# Patient Record
Sex: Female | Born: 1965
Health system: Southern US, Community
[De-identification: ages and names within clinical notes are randomized; demographics above are authoritative.]

## PROBLEM LIST (undated history)

## (undated) DIAGNOSIS — K5792 Diverticulitis of intestine, part unspecified, without perforation or abscess without bleeding: Secondary | ICD-10-CM

---

## 2011-07-28 ENCOUNTER — Other Ambulatory Visit: Payer: Self-pay | Admitting: Oncology

## 2015-09-06 DIAGNOSIS — Z111 Encounter for screening for respiratory tuberculosis: Secondary | ICD-10-CM | POA: Diagnosis not present

## 2015-10-20 DIAGNOSIS — R112 Nausea with vomiting, unspecified: Secondary | ICD-10-CM | POA: Diagnosis not present

## 2015-10-20 DIAGNOSIS — H8113 Benign paroxysmal vertigo, bilateral: Secondary | ICD-10-CM | POA: Diagnosis not present

## 2015-10-20 DIAGNOSIS — R42 Dizziness and giddiness: Secondary | ICD-10-CM | POA: Diagnosis not present

## 2015-10-20 DIAGNOSIS — H66002 Acute suppurative otitis media without spontaneous rupture of ear drum, left ear: Secondary | ICD-10-CM | POA: Diagnosis not present

## 2015-10-25 DIAGNOSIS — K5732 Diverticulitis of large intestine without perforation or abscess without bleeding: Secondary | ICD-10-CM | POA: Diagnosis not present

## 2015-10-25 DIAGNOSIS — H6692 Otitis media, unspecified, left ear: Secondary | ICD-10-CM | POA: Diagnosis not present

## 2016-01-25 DIAGNOSIS — K5732 Diverticulitis of large intestine without perforation or abscess without bleeding: Secondary | ICD-10-CM | POA: Diagnosis not present

## 2016-02-14 DIAGNOSIS — Z23 Encounter for immunization: Secondary | ICD-10-CM | POA: Diagnosis not present

## 2016-05-02 DIAGNOSIS — Z1231 Encounter for screening mammogram for malignant neoplasm of breast: Secondary | ICD-10-CM | POA: Diagnosis not present

## 2016-07-11 DIAGNOSIS — Z1151 Encounter for screening for human papillomavirus (HPV): Secondary | ICD-10-CM | POA: Diagnosis not present

## 2016-07-11 DIAGNOSIS — Z01419 Encounter for gynecological examination (general) (routine) without abnormal findings: Secondary | ICD-10-CM | POA: Diagnosis not present

## 2016-07-11 DIAGNOSIS — G479 Sleep disorder, unspecified: Secondary | ICD-10-CM | POA: Diagnosis not present

## 2016-07-11 DIAGNOSIS — L659 Nonscarring hair loss, unspecified: Secondary | ICD-10-CM | POA: Diagnosis not present

## 2016-07-11 DIAGNOSIS — Z6824 Body mass index (BMI) 24.0-24.9, adult: Secondary | ICD-10-CM | POA: Diagnosis not present

## 2016-07-11 DIAGNOSIS — R5383 Other fatigue: Secondary | ICD-10-CM | POA: Diagnosis not present

## 2016-10-02 DIAGNOSIS — H1030 Unspecified acute conjunctivitis, unspecified eye: Secondary | ICD-10-CM | POA: Diagnosis not present

## 2016-10-02 DIAGNOSIS — R030 Elevated blood-pressure reading, without diagnosis of hypertension: Secondary | ICD-10-CM | POA: Diagnosis not present

## 2016-10-02 DIAGNOSIS — Z6824 Body mass index (BMI) 24.0-24.9, adult: Secondary | ICD-10-CM | POA: Diagnosis not present

## 2016-10-09 DIAGNOSIS — K080 Exfoliation of teeth due to systemic causes: Secondary | ICD-10-CM | POA: Diagnosis not present

## 2016-11-14 DIAGNOSIS — N63 Unspecified lump in unspecified breast: Secondary | ICD-10-CM | POA: Diagnosis not present

## 2016-11-14 DIAGNOSIS — Z6824 Body mass index (BMI) 24.0-24.9, adult: Secondary | ICD-10-CM | POA: Diagnosis not present

## 2016-11-16 DIAGNOSIS — N6320 Unspecified lump in the left breast, unspecified quadrant: Secondary | ICD-10-CM | POA: Diagnosis not present

## 2016-11-16 DIAGNOSIS — N6002 Solitary cyst of left breast: Secondary | ICD-10-CM | POA: Diagnosis not present

## 2016-11-16 DIAGNOSIS — R922 Inconclusive mammogram: Secondary | ICD-10-CM | POA: Diagnosis not present

## 2017-02-16 DIAGNOSIS — Z23 Encounter for immunization: Secondary | ICD-10-CM | POA: Diagnosis not present

## 2017-02-16 DIAGNOSIS — Z803 Family history of malignant neoplasm of breast: Secondary | ICD-10-CM | POA: Diagnosis not present

## 2017-02-16 DIAGNOSIS — L659 Nonscarring hair loss, unspecified: Secondary | ICD-10-CM | POA: Diagnosis not present

## 2017-02-16 DIAGNOSIS — R5383 Other fatigue: Secondary | ICD-10-CM | POA: Diagnosis not present

## 2017-02-16 DIAGNOSIS — Z3009 Encounter for other general counseling and advice on contraception: Secondary | ICD-10-CM | POA: Diagnosis not present

## 2017-04-20 DIAGNOSIS — Z6824 Body mass index (BMI) 24.0-24.9, adult: Secondary | ICD-10-CM | POA: Diagnosis not present

## 2017-04-20 DIAGNOSIS — N951 Menopausal and female climacteric states: Secondary | ICD-10-CM | POA: Diagnosis not present

## 2017-04-20 DIAGNOSIS — F430 Acute stress reaction: Secondary | ICD-10-CM | POA: Diagnosis not present

## 2017-04-20 DIAGNOSIS — M545 Low back pain: Secondary | ICD-10-CM | POA: Diagnosis not present

## 2017-06-06 DIAGNOSIS — Z3202 Encounter for pregnancy test, result negative: Secondary | ICD-10-CM | POA: Diagnosis not present

## 2017-07-20 DIAGNOSIS — Z01419 Encounter for gynecological examination (general) (routine) without abnormal findings: Secondary | ICD-10-CM | POA: Diagnosis not present

## 2017-07-20 DIAGNOSIS — N951 Menopausal and female climacteric states: Secondary | ICD-10-CM | POA: Diagnosis not present

## 2017-07-20 DIAGNOSIS — R591 Generalized enlarged lymph nodes: Secondary | ICD-10-CM | POA: Diagnosis not present

## 2017-07-20 DIAGNOSIS — R590 Localized enlarged lymph nodes: Secondary | ICD-10-CM | POA: Diagnosis not present

## 2017-07-27 DIAGNOSIS — Z1231 Encounter for screening mammogram for malignant neoplasm of breast: Secondary | ICD-10-CM | POA: Diagnosis not present

## 2017-08-07 DIAGNOSIS — Z111 Encounter for screening for respiratory tuberculosis: Secondary | ICD-10-CM | POA: Diagnosis not present

## 2017-08-08 DIAGNOSIS — K080 Exfoliation of teeth due to systemic causes: Secondary | ICD-10-CM | POA: Diagnosis not present

## 2017-09-21 DIAGNOSIS — K5732 Diverticulitis of large intestine without perforation or abscess without bleeding: Secondary | ICD-10-CM | POA: Diagnosis not present

## 2017-12-27 ENCOUNTER — Emergency Department (HOSPITAL_BASED_OUTPATIENT_CLINIC_OR_DEPARTMENT_OTHER)
Admission: EM | Admit: 2017-12-27 | Discharge: 2017-12-27 | Disposition: A | Discharge status: Home / Self Care | Payer: Federal, State, Local not specified - PPO | Attending: Emergency Medicine | Admitting: Emergency Medicine

## 2017-12-27 ENCOUNTER — Encounter (HOSPITAL_BASED_OUTPATIENT_CLINIC_OR_DEPARTMENT_OTHER): Payer: Self-pay | Admitting: Emergency Medicine

## 2017-12-27 ENCOUNTER — Emergency Department (HOSPITAL_BASED_OUTPATIENT_CLINIC_OR_DEPARTMENT_OTHER): Payer: Federal, State, Local not specified - PPO

## 2017-12-27 ENCOUNTER — Other Ambulatory Visit: Payer: Self-pay

## 2017-12-27 DIAGNOSIS — Z79899 Other long term (current) drug therapy: Secondary | ICD-10-CM | POA: Diagnosis not present

## 2017-12-27 DIAGNOSIS — R110 Nausea: Secondary | ICD-10-CM | POA: Diagnosis not present

## 2017-12-27 DIAGNOSIS — R1030 Lower abdominal pain, unspecified: Secondary | ICD-10-CM | POA: Insufficient documentation

## 2017-12-27 DIAGNOSIS — K5730 Diverticulosis of large intestine without perforation or abscess without bleeding: Secondary | ICD-10-CM | POA: Diagnosis not present

## 2017-12-27 HISTORY — DX: Diverticulitis of intestine, part unspecified, without perforation or abscess without bleeding: K57.92

## 2017-12-27 LAB — BASIC METABOLIC PANEL WITH GFR
Anion gap: 11 (ref 5–15)
BUN: 11 mg/dL (ref 6–20)
CO2: 23 mmol/L (ref 22–32)
Calcium: 8.7 mg/dL — ABNORMAL LOW (ref 8.9–10.3)
Chloride: 105 mmol/L (ref 98–111)
Creatinine, Ser: 0.7 mg/dL (ref 0.44–1.00)
GFR calc Af Amer: >60 mL/min (ref >60)
GFR calc non Af Amer: >60 mL/min (ref >60)
Glucose, Bld: 127 mg/dL — ABNORMAL HIGH (ref 70–99)
Potassium: 3.8 mmol/L (ref 3.5–5.1)
Sodium: 139 mmol/L (ref 135–145)

## 2017-12-27 LAB — URINALYSIS, ROUTINE W REFLEX MICROSCOPIC
Bilirubin Urine: NEGATIVE
Glucose, UA: NEGATIVE mg/dL
Ketones, ur: 40 mg/dL — AB
Leukocytes, UA: NEGATIVE
Nitrite: NEGATIVE
Protein, ur: NEGATIVE mg/dL
Specific Gravity, Urine: 1.02 (ref 1.005–1.030)
pH: 5.5 (ref 5.0–8.0)

## 2017-12-27 LAB — URINALYSIS, MICROSCOPIC (REFLEX)

## 2017-12-27 LAB — CBC WITH DIFFERENTIAL/PLATELET
Basophils Absolute: 0 K/uL (ref 0.0–0.1)
Basophils Relative: 0 %
Eosinophils Absolute: 0 K/uL (ref 0.0–0.7)
Eosinophils Relative: 0 %
HCT: 42.2 % (ref 36.0–46.0)
Hemoglobin: 14.4 g/dL (ref 12.0–15.0)
Lymphocytes Relative: 10 %
Lymphs Abs: 1 K/uL (ref 0.7–4.0)
MCH: 30.4 pg (ref 26.0–34.0)
MCHC: 34.1 g/dL (ref 30.0–36.0)
MCV: 89 fL (ref 78.0–100.0)
Monocytes Absolute: 0.5 K/uL (ref 0.1–1.0)
Monocytes Relative: 5 %
Neutro Abs: 8.3 K/uL — ABNORMAL HIGH (ref 1.7–7.7)
Neutrophils Relative %: 85 %
Platelets: 238 K/uL (ref 150–400)
RBC: 4.74 MIL/uL (ref 3.87–5.11)
RDW: 13.6 % (ref 11.5–15.5)
WBC: 9.9 K/uL (ref 4.0–10.5)

## 2017-12-27 MED ORDER — FENTANYL CITRATE (PF) 100 MCG/2ML IJ SOLN
50.0000 ug | Freq: Once | INTRAMUSCULAR | Status: AC
  Administered 2017-12-27: 50 ug via INTRAVENOUS
  Filled 2017-12-27: qty 2

## 2017-12-27 MED ORDER — IOPAMIDOL (ISOVUE-300) INJECTION 61%
100.0000 mL | Freq: Once | INTRAVENOUS | Status: AC | PRN
  Administered 2017-12-27: 100 mL via INTRAVENOUS

## 2017-12-27 MED ORDER — ONDANSETRON HCL 4 MG/2ML IJ SOLN
4.0000 mg | Freq: Once | INTRAMUSCULAR | Status: AC
  Administered 2017-12-27: 4 mg via INTRAVENOUS
  Filled 2017-12-27: qty 2

## 2017-12-27 MED ORDER — TRAMADOL HCL 50 MG PO TABS: 50.0000 mg | ORAL_TABLET | Freq: Two times a day (BID) | ORAL | 0 refills | Status: AC | PRN

## 2017-12-27 MED ORDER — DICYCLOMINE HCL 20 MG PO TABS: 20.0000 mg | ORAL_TABLET | Freq: Three times a day (TID) | ORAL | 0 refills | Status: AC | PRN

## 2017-12-27 MED ORDER — IOPAMIDOL (ISOVUE-300) INJECTION 61%
30.0000 mL | Freq: Once | INTRAVENOUS | Status: AC | PRN
  Administered 2017-12-27: 30 mL via ORAL

## 2017-12-27 MED ORDER — SODIUM CHLORIDE 0.9 % IV BOLUS
500.0000 mL | Freq: Once | INTRAVENOUS | Status: AC
  Administered 2017-12-27: 500 mL via INTRAVENOUS

## 2017-12-27 MED FILL — DICYCLOMINE 20 MG TABLET: 20 | 3 days supply | Qty: 10 | Fill #0

## 2017-12-27 MED FILL — traMADol HCL 50 MG TABS: 50 | 4 days supply | Qty: 8 | Fill #0

## 2017-12-27 NOTE — ED Notes (Signed)
ED Provider at bedside. 

## 2017-12-27 NOTE — ED Triage Notes (Signed)
Lower abd pain for several days with nausea. States she just finished abx for diverticulitis with no relief.

## 2017-12-27 NOTE — ED Provider Notes (Signed)
[facility] EMERGENCY DEPARTMENT Provider Note   CSN: [id number] Arrival date & time: 12/27/17  0705     History   Chief Complaint Chief Complaint  Patient presents with  . Abdominal Pain    HPI Jamie Patton is a 52 y.o. female.  HPI Patient presents with abdominal pain.  Began around a week and a half ago.  Has been on Avelox for diverticulitis.  Has had diverticulitis several times in the past.  Pain had improved some but today became much more severe.  Has had some nausea and vomiting.  No diarrhea.  No fevers but had had chills previously.  States the pain was much more severe today.  Almost felt as if the cramping in her lower abdomen.  States the tenderness is improved. Past Medical History:  Diagnosis Date  . Diverticulitis     There are no active problems to display for this patient.   Past Surgical History:  Procedure Laterality Date  . CESAREAN SECTION       OB History   None      Home Medications    Prior to Admission medications   Medication Sig Start Date End Date Taking? Authorizing Provider  norethindrone-ethinyl estradiol-iron (ESTROSTEP FE,TILIA FE,TRI-LEGEST FE) 1-20/1-30/1-35 MG-MCG tablet Take 1 tablet by mouth daily.   Yes [provider]  dicyclomine (BENTYL) 20 MG tablet Take 1 tablet (20 mg total) by mouth 3 (three) times daily as needed for spasms. 12/27/17   Benjiman Core, MD  traMADol (ULTRAM) 50 MG tablet Take 1 tablet (50 mg total) by mouth every 12 (twelve) hours as needed. 12/27/17   Benjiman Core, MD    Family History No family history on file.  Social History Social History   Tobacco Use  . Smoking status: Never Smoker  . Smokeless tobacco: Never Used  Substance Use Topics  . Alcohol use: Never    Frequency: Never  . Drug use: Not on file     Allergies   Penicillins   Review of Systems Review of Systems  Constitutional: Positive for appetite change. Negative for fever.  HENT: Negative  for congestion.   Respiratory: Negative for shortness of breath.   Cardiovascular: Negative for chest pain.  Gastrointestinal: Positive for abdominal pain and nausea.  Genitourinary: Negative for dysuria, vaginal bleeding and vaginal discharge.  Musculoskeletal: Negative for back pain.  Skin: Negative for rash.  Neurological: Negative for weakness.  Psychiatric/Behavioral: Negative for confusion.     Physical Exam Updated Vital Signs BP 130/88 (BP Location: Right Arm)   Pulse 86   Temp 98.7 F (37.1 C) (Oral)   Resp 18   Ht 5' (1.524 m)   Wt 59 kg   SpO2 97%   BMI 25.39 kg/m   Physical Exam  Constitutional: She appears well-developed.  HENT:  Head: Normocephalic.  Eyes: Pupils are equal, round, and reactive to light.  Cardiovascular: Normal rate.  Pulmonary/Chest: Breath sounds normal.  Abdominal: Normal appearance.  No tenderness or mass.  Neurological: She is alert.  Skin: Skin is warm. Capillary refill takes less than 2 seconds.     ED Treatments / Results  Labs (all labs ordered are listed, but only abnormal results are displayed) Labs Reviewed  BASIC METABOLIC PANEL - Abnormal; Notable for the following components:      Result Value   Glucose, Bld 127 (*)    Calcium 8.7 (*)    All other components within normal limits  CBC WITH DIFFERENTIAL/PLATELET - Abnormal; Notable  for the following components:   Neutro Abs 8.3 (*)    All other components within normal limits  URINALYSIS, ROUTINE W REFLEX MICROSCOPIC - Abnormal; Notable for the following components:   Hgb urine dipstick TRACE (*)    Ketones, ur 40 (*)    All other components within normal limits  URINALYSIS, MICROSCOPIC (REFLEX) - Abnormal; Notable for the following components:   Bacteria, UA FEW (*)    All other components within normal limits    EKG None  Radiology Ct Abdomen Pelvis W Contrast  Result Date: 12/27/2017 CLINICAL DATA:  52 year old female with a history lower abdominal pain  for several days. EXAM: CT ABDOMEN AND PELVIS WITH CONTRAST TECHNIQUE: Multidetector CT imaging of the abdomen and pelvis was performed using the standard protocol following bolus administration of intravenous contrast. CONTRAST:  30mL ISOVUE-300 IOPAMIDOL (ISOVUE-300) INJECTION 61%, ISOVUE-300 IOPAMIDOL (ISOVUE-300) INJECTION 61% COMPARISON:  None. FINDINGS: Lower chest: No acute abnormality. Hepatobiliary: Unremarkable appearance of the liver. Unremarkable gallbladder Pancreas: Unremarkable pancreas Spleen: Unremarkable spleen Adrenals/Urinary Tract: Unremarkable appearance of the adrenal glands. No evidence of hydronephrosis of the right or left kidney. No nephrolithiasis. Unremarkable course of the bilateral ureters. Unremarkable appearance of the urinary bladder. Stomach/Bowel: Unremarkable appearance of stomach. Unremarkable small bowel. No abnormal distention or transition point. Normal appendix. Colonic diverticula are present. No significant inflammatory changes. No focal fluid. No evidence of extraluminal air. Vascular/Lymphatic: No significant atherosclerotic changes. Mesenteric arteries, renal arteries, bilateral iliac arteries and proximal femoral arteries patent. No evidence of lymphadenopathy. Reproductive: Physiologic changes of the uterus. Cystic structure associated with the right adnexa measures 2.5 cm. Other: Small fat containing umbilical hernia. Musculoskeletal: No acute displaced fracture. Degenerative changes of the spine. IMPRESSION: No acute CT finding. Colonic diverticular disease without evidence of acute diverticulitis on the current CT. Physiologic changes of the uterus and adnexa, including a right-sided ovarian cyst/follicle. Electronically Signed   By: Gilmer Mor D.O.   On: 12/27/2017 10:22    Procedures Procedures (including critical care time)  Medications Ordered in ED Medications  sodium chloride 0.9 % bolus 500 mL (0 mLs Intravenous Stopped 12/27/17 0907)    fentaNYL (SUBLIMAZE) injection 50 mcg (50 mcg Intravenous Given 12/27/17 0751)  ondansetron (ZOFRAN) injection 4 mg (4 mg Intravenous Given 12/27/17 0751)  iopamidol (ISOVUE-300) 61 % injection 100 mL (100 mLs Intravenous Contrast Given 12/27/17 0939)  iopamidol (ISOVUE-300) 61 % injection 30 mL (30 mLs Oral Contrast Given 12/27/17 0939)     Initial Impression / Assessment and Plan / ED Course  I have reviewed the triage vital signs and the nursing notes.  Pertinent labs & imaging results that were available during my care of the patient were reviewed by me and considered in my medical decision making (see chart for details).     Patient with abdominal pain.  Recently presumed diverticulitis treated with antibiotics.  Pain increased today.  However tenderness is improved.  Lab work reassuring and CT scan reassuring except for a ovarian cyst.  Will treat with antispasmodics and small dose pain medicine.  Follow-up with PCP as needed.  Final Clinical Impressions(s) / ED Diagnoses   Final diagnoses:  Lower abdominal pain    ED Discharge Orders         Ordered    dicyclomine (BENTYL) 20 MG tablet  3 times daily PRN     12/27/17 1129    traMADol (ULTRAM) 50 MG tablet  Every 12 hours PRN     12/27/17 1129  Benjiman CorePickering, Mykel Mohl, MD 12/27/17 1131

## 2017-12-27 NOTE — Discharge Instructions (Addendum)
Follow-up with your doctor if your abdominal pain continues.  You also been ovarian cyst may need to be followed since you are postmenopausal.

## 2017-12-27 NOTE — ED Notes (Signed)
Patient transported to CT 

## 2018-01-04 DIAGNOSIS — N939 Abnormal uterine and vaginal bleeding, unspecified: Secondary | ICD-10-CM | POA: Diagnosis not present

## 2018-01-04 DIAGNOSIS — R102 Pelvic and perineal pain: Secondary | ICD-10-CM | POA: Diagnosis not present

## 2018-01-04 DIAGNOSIS — Z3202 Encounter for pregnancy test, result negative: Secondary | ICD-10-CM | POA: Diagnosis not present

## 2018-01-04 DIAGNOSIS — N83201 Unspecified ovarian cyst, right side: Secondary | ICD-10-CM | POA: Diagnosis not present

## 2018-01-17 DIAGNOSIS — Z23 Encounter for immunization: Secondary | ICD-10-CM | POA: Diagnosis not present

## 2018-02-14 DIAGNOSIS — N83201 Unspecified ovarian cyst, right side: Secondary | ICD-10-CM | POA: Diagnosis not present

## 2018-05-10 DIAGNOSIS — R102 Pelvic and perineal pain: Secondary | ICD-10-CM | POA: Diagnosis not present

## 2018-05-10 DIAGNOSIS — N83201 Unspecified ovarian cyst, right side: Secondary | ICD-10-CM | POA: Diagnosis not present

## 2018-06-06 DIAGNOSIS — R102 Pelvic and perineal pain: Secondary | ICD-10-CM | POA: Diagnosis not present

## 2018-06-06 DIAGNOSIS — Z8742 Personal history of other diseases of the female genital tract: Secondary | ICD-10-CM | POA: Diagnosis not present

## 2018-10-14 DIAGNOSIS — Z01419 Encounter for gynecological examination (general) (routine) without abnormal findings: Secondary | ICD-10-CM | POA: Diagnosis not present

## 2018-10-21 DIAGNOSIS — Z1231 Encounter for screening mammogram for malignant neoplasm of breast: Secondary | ICD-10-CM | POA: Diagnosis not present

## 2019-01-18 DIAGNOSIS — K5732 Diverticulitis of large intestine without perforation or abscess without bleeding: Secondary | ICD-10-CM | POA: Diagnosis not present

## 2019-01-18 DIAGNOSIS — N3001 Acute cystitis with hematuria: Secondary | ICD-10-CM | POA: Diagnosis not present

## 2019-01-18 DIAGNOSIS — R109 Unspecified abdominal pain: Secondary | ICD-10-CM | POA: Diagnosis not present

## 2019-02-11 DIAGNOSIS — K589 Irritable bowel syndrome without diarrhea: Secondary | ICD-10-CM | POA: Diagnosis not present

## 2019-02-11 DIAGNOSIS — Z6825 Body mass index (BMI) 25.0-25.9, adult: Secondary | ICD-10-CM | POA: Diagnosis not present

## 2019-02-11 DIAGNOSIS — Z23 Encounter for immunization: Secondary | ICD-10-CM | POA: Diagnosis not present

## 2019-02-11 DIAGNOSIS — K5792 Diverticulitis of intestine, part unspecified, without perforation or abscess without bleeding: Secondary | ICD-10-CM | POA: Diagnosis not present

## 2019-10-24 DIAGNOSIS — F419 Anxiety disorder, unspecified: Secondary | ICD-10-CM | POA: Diagnosis not present

## 2019-10-24 DIAGNOSIS — Z1151 Encounter for screening for human papillomavirus (HPV): Secondary | ICD-10-CM | POA: Diagnosis not present

## 2019-10-24 DIAGNOSIS — Z1231 Encounter for screening mammogram for malignant neoplasm of breast: Secondary | ICD-10-CM | POA: Diagnosis not present

## 2019-10-24 DIAGNOSIS — Z01411 Encounter for gynecological examination (general) (routine) with abnormal findings: Secondary | ICD-10-CM | POA: Diagnosis not present

## 2019-10-24 DIAGNOSIS — Z803 Family history of malignant neoplasm of breast: Secondary | ICD-10-CM | POA: Diagnosis not present

## 2019-10-24 DIAGNOSIS — Z01419 Encounter for gynecological examination (general) (routine) without abnormal findings: Secondary | ICD-10-CM | POA: Diagnosis not present

## 2019-10-24 DIAGNOSIS — G479 Sleep disorder, unspecified: Secondary | ICD-10-CM | POA: Diagnosis not present

## 2020-01-13 DIAGNOSIS — J22 Unspecified acute lower respiratory infection: Secondary | ICD-10-CM | POA: Diagnosis not present

## 2020-01-13 DIAGNOSIS — R197 Diarrhea, unspecified: Secondary | ICD-10-CM | POA: Diagnosis not present

## 2020-01-13 DIAGNOSIS — R42 Dizziness and giddiness: Secondary | ICD-10-CM | POA: Diagnosis not present

## 2020-01-13 DIAGNOSIS — Z20828 Contact with and (suspected) exposure to other viral communicable diseases: Secondary | ICD-10-CM | POA: Diagnosis not present

## 2020-02-13 DIAGNOSIS — Z6825 Body mass index (BMI) 25.0-25.9, adult: Secondary | ICD-10-CM | POA: Diagnosis not present

## 2020-02-13 DIAGNOSIS — K580 Irritable bowel syndrome with diarrhea: Secondary | ICD-10-CM | POA: Diagnosis not present

## 2020-02-13 DIAGNOSIS — K5792 Diverticulitis of intestine, part unspecified, without perforation or abscess without bleeding: Secondary | ICD-10-CM | POA: Diagnosis not present

## 2020-03-01 IMAGING — CT CT ABD-PELV W/ CM
2 of 5 series · 16 of 46 positions shown, 18 images · IV contrast (APPLIED)
Comparison: None.

CLINICAL DATA: 51-year-old female with a history lower abdominal
pain for several days.

EXAM:
CT ABDOMEN AND PELVIS WITH CONTRAST
TECHNIQUE: Multidetector CT imaging of the abdomen and pelvis was performed
using the standard protocol following bolus administration of
intravenous contrast.
CONTRAST:  30mL O08YXP-DLL IOPAMIDOL (O08YXP-DLL) INJECTION 61%,
100mL O08YXP-DLL IOPAMIDOL (O08YXP-DLL) INJECTION 61%

[Series 2: axial st · axial · 0.88mm/px · z∈[-449,-59]mm · 13 of 88 slices shown, 15 images]
[im 5/88  soft-tissue]
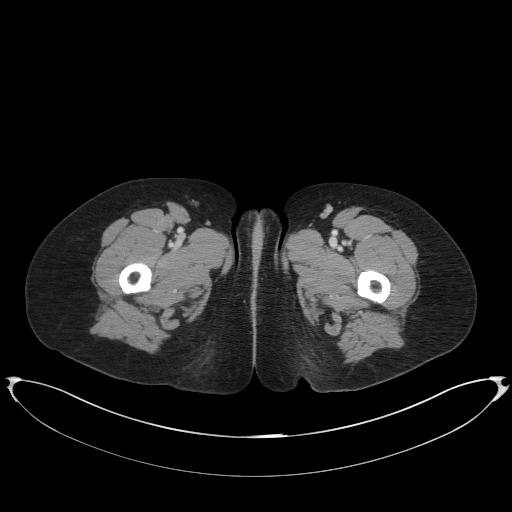
[im 5/88  bone]
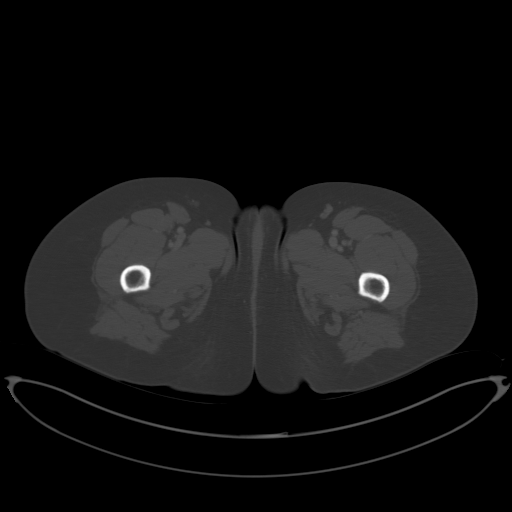
[im 10/88  soft-tissue]
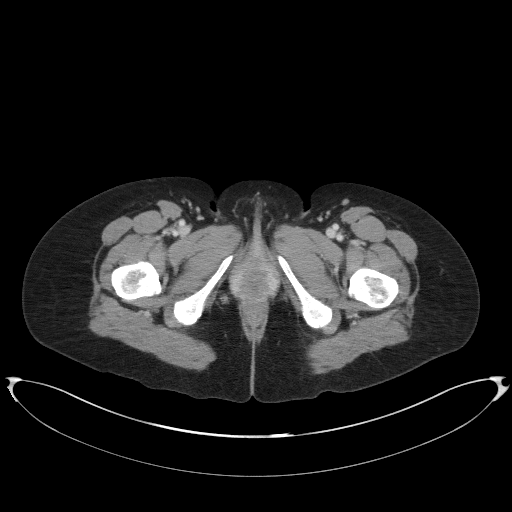
[im 20/88  soft-tissue]
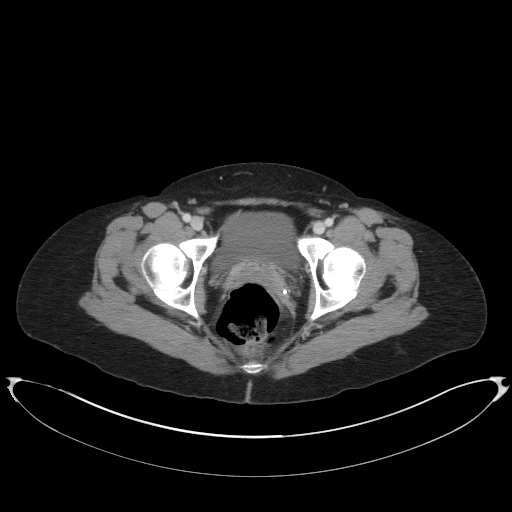
[im 25/88  soft-tissue]
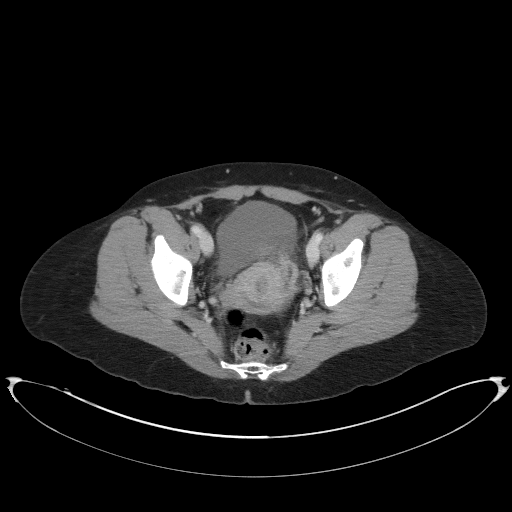
[im 30/88  soft-tissue]
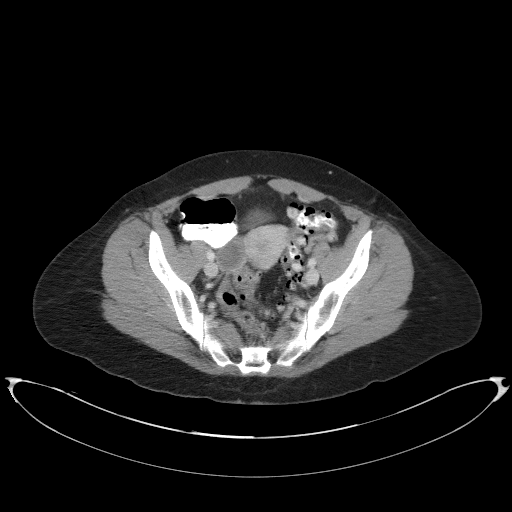
[im 39/88  soft-tissue]
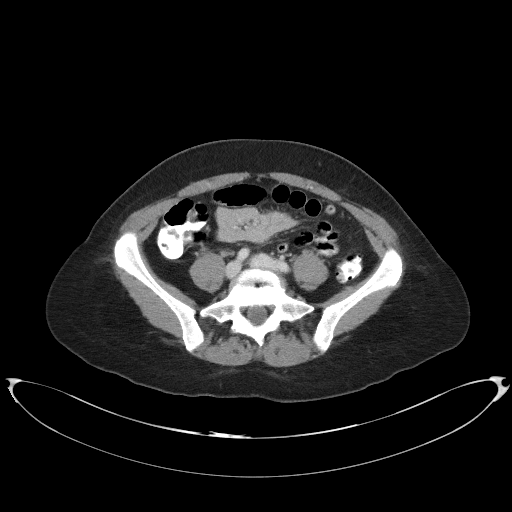
[im 44/88  soft-tissue]
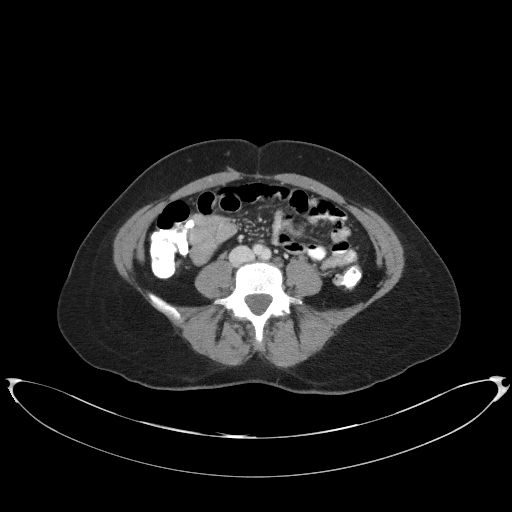
[im 49/88  soft-tissue]
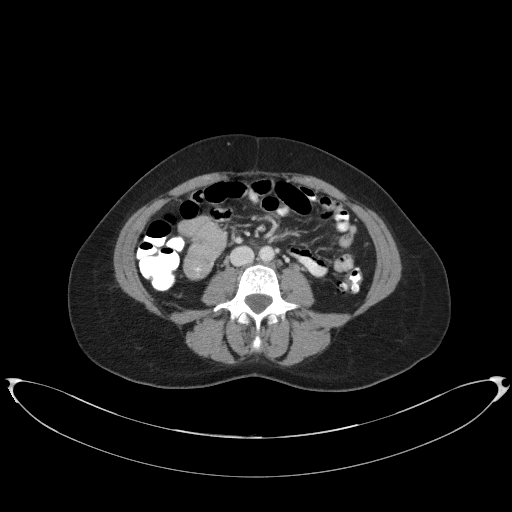
[im 59/88  soft-tissue]
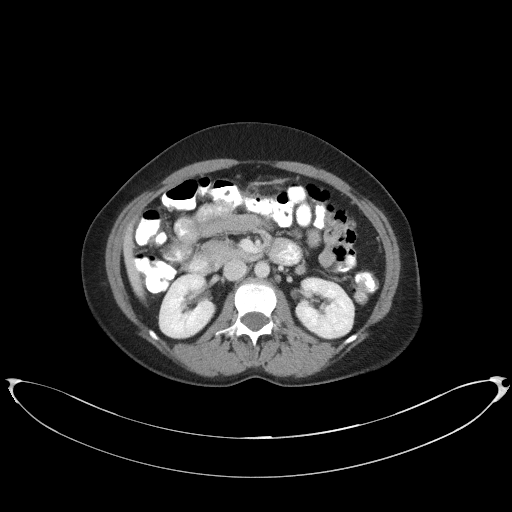
[im 59/88  bone]
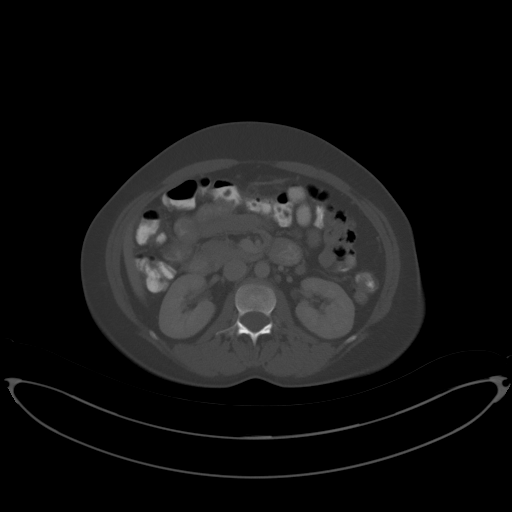
[im 63/88  soft-tissue]
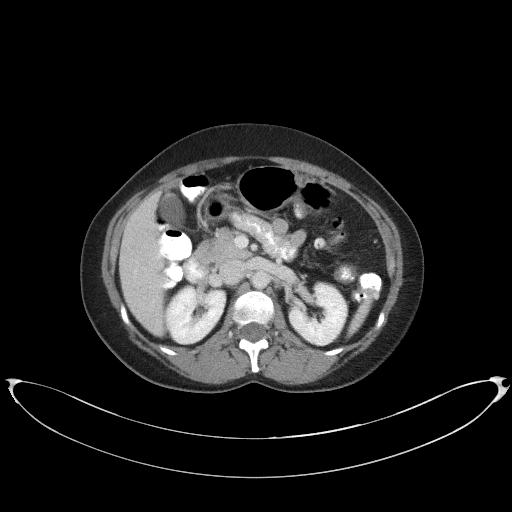
[im 68/88  soft-tissue]
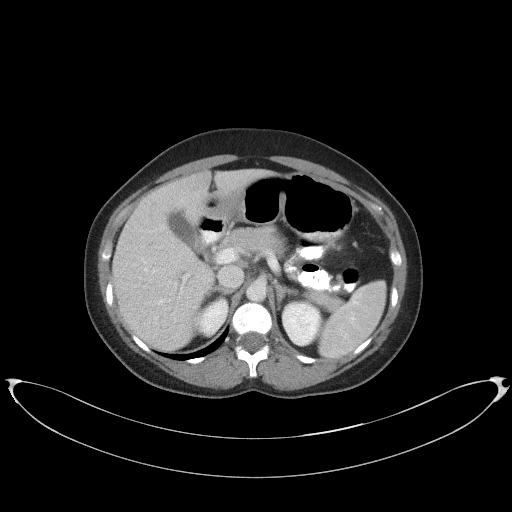
[im 78/88  soft-tissue]
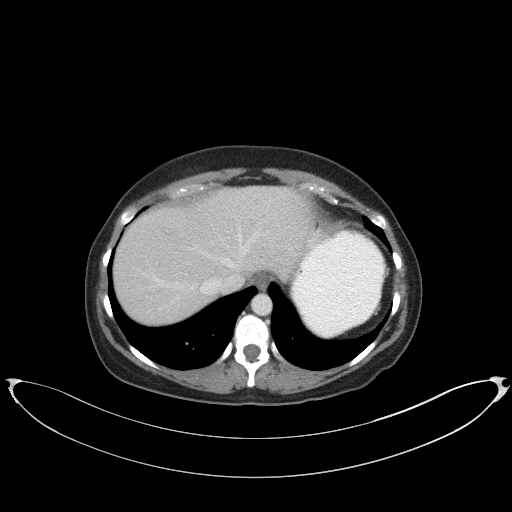
[im 83/88  soft-tissue]
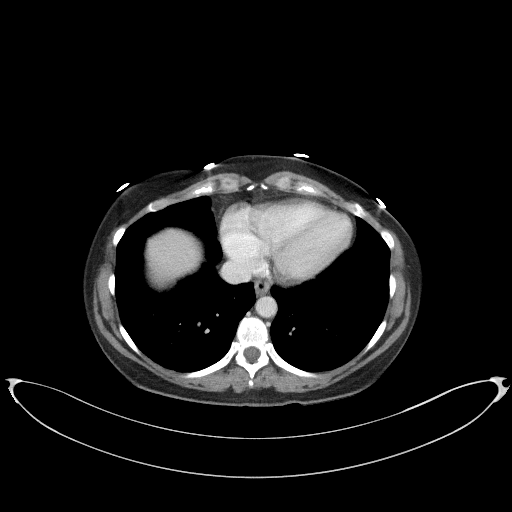

[Series 5: coronal st · coronal · 0.71mm/px · 3 of 83 slices shown]
[im 28/83  soft-tissue]
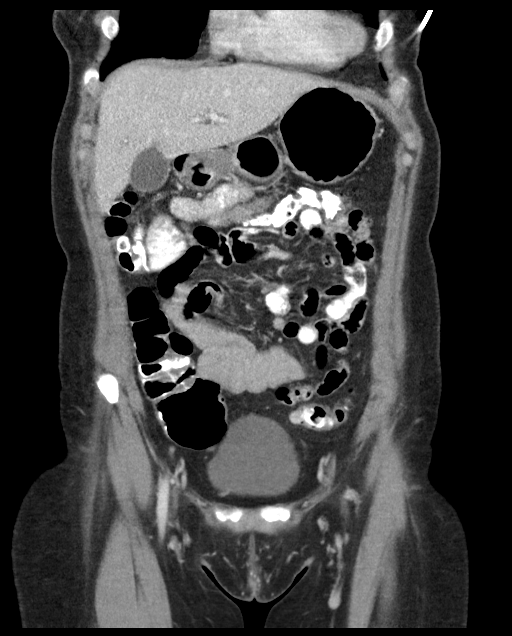
[im 37/83  soft-tissue]
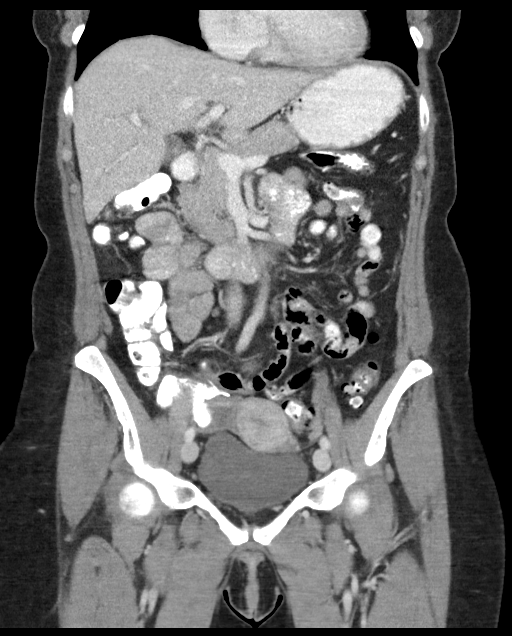
[im 46/83  soft-tissue]
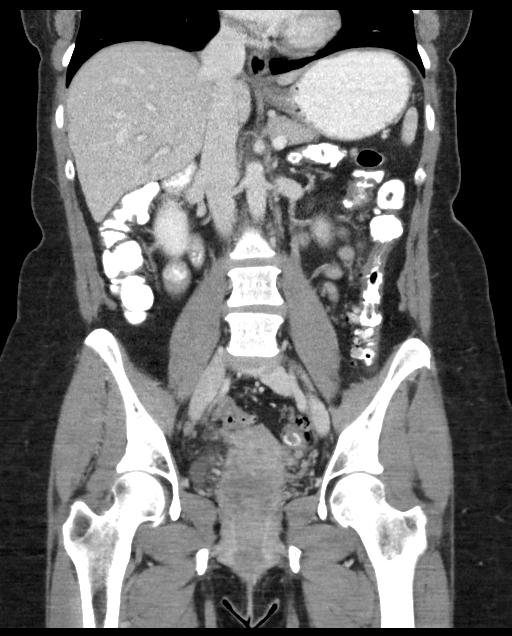

[16 of 46 positions shown; findings below may reference images not displayed]

FINDINGS: Lower chest: No acute abnormality.

Hepatobiliary: Unremarkable appearance of the liver. Unremarkable
gallbladder

Pancreas: Unremarkable pancreas

Spleen: Unremarkable spleen

Adrenals/Urinary Tract: Unremarkable appearance of the adrenal
glands. No evidence of hydronephrosis of the right or left kidney.
No nephrolithiasis. Unremarkable course of the bilateral ureters.
Unremarkable appearance of the urinary bladder.

Stomach/Bowel: Unremarkable appearance of stomach. Unremarkable
small bowel. No abnormal distention or transition point. Normal
appendix. Colonic diverticula are present. No significant
inflammatory changes. No focal fluid. No evidence of extraluminal
air.

Vascular/Lymphatic: No significant atherosclerotic changes.
Mesenteric arteries, renal arteries, bilateral iliac arteries and
proximal femoral arteries patent.

No evidence of lymphadenopathy.

Reproductive: Physiologic changes of the uterus. Cystic structure
associated with the right adnexa measures 2.5 cm.

Other: Small fat containing umbilical hernia.

Musculoskeletal: No acute displaced fracture. Degenerative changes
of the spine.
IMPRESSION: No acute CT finding.

Colonic diverticular disease without evidence of acute
diverticulitis on the current CT.

Physiologic changes of the uterus and adnexa, including a
right-sided ovarian cyst/follicle..

## 2020-11-05 DIAGNOSIS — Z1231 Encounter for screening mammogram for malignant neoplasm of breast: Secondary | ICD-10-CM | POA: Diagnosis not present

## 2021-06-28 DIAGNOSIS — Z20828 Contact with and (suspected) exposure to other viral communicable diseases: Secondary | ICD-10-CM | POA: Diagnosis not present

## 2021-06-28 DIAGNOSIS — Z6827 Body mass index (BMI) 27.0-27.9, adult: Secondary | ICD-10-CM | POA: Diagnosis not present

## 2021-06-28 DIAGNOSIS — J329 Chronic sinusitis, unspecified: Secondary | ICD-10-CM | POA: Diagnosis not present

## 2021-06-28 DIAGNOSIS — J40 Bronchitis, not specified as acute or chronic: Secondary | ICD-10-CM | POA: Diagnosis not present

## 2021-07-25 DIAGNOSIS — L821 Other seborrheic keratosis: Secondary | ICD-10-CM | POA: Diagnosis not present

## 2022-12-29 DIAGNOSIS — R1030 Lower abdominal pain, unspecified: Secondary | ICD-10-CM | POA: Diagnosis not present

## 2022-12-29 DIAGNOSIS — Z6823 Body mass index (BMI) 23.0-23.9, adult: Secondary | ICD-10-CM | POA: Diagnosis not present

## 2022-12-29 DIAGNOSIS — B3731 Acute candidiasis of vulva and vagina: Secondary | ICD-10-CM | POA: Diagnosis not present

## 2024-01-01 DIAGNOSIS — K5732 Diverticulitis of large intestine without perforation or abscess without bleeding: Secondary | ICD-10-CM | POA: Diagnosis not present

## 2024-01-01 DIAGNOSIS — R110 Nausea: Secondary | ICD-10-CM | POA: Diagnosis not present

## 2024-01-19 ENCOUNTER — Other Ambulatory Visit: Payer: Self-pay

## 2024-01-19 ENCOUNTER — Emergency Department (HOSPITAL_BASED_OUTPATIENT_CLINIC_OR_DEPARTMENT_OTHER)
Admission: EM | Admit: 2024-01-19 | Discharge: 2024-01-19 | Disposition: A | Discharge status: Home / Self Care | Attending: Emergency Medicine | Admitting: Emergency Medicine

## 2024-01-19 ENCOUNTER — Encounter (HOSPITAL_BASED_OUTPATIENT_CLINIC_OR_DEPARTMENT_OTHER): Payer: Self-pay | Admitting: Emergency Medicine

## 2024-01-19 DIAGNOSIS — N3090 Cystitis, unspecified without hematuria: Secondary | ICD-10-CM | POA: Diagnosis not present

## 2024-01-19 DIAGNOSIS — R300 Dysuria: Secondary | ICD-10-CM | POA: Diagnosis not present

## 2024-01-19 LAB — URINALYSIS, ROUTINE W REFLEX MICROSCOPIC
Bilirubin Urine: NEGATIVE
Glucose, UA: NEGATIVE mg/dL
Ketones, ur: NEGATIVE mg/dL
Nitrite: POSITIVE — AB
Protein, ur: 30 mg/dL — AB
Specific Gravity, Urine: >=1.03 (ref 1.005–1.030)
pH: 6 (ref 5.0–8.0)

## 2024-01-19 LAB — URINALYSIS, MICROSCOPIC (REFLEX)

## 2024-01-19 MED ORDER — CEFADROXIL 500 MG PO CAPS
500.0000 mg | ORAL_CAPSULE | Freq: Two times a day (BID) | ORAL | 0 refills | Status: DC
  Filled 2024-01-19: qty 12, 6d supply, fill #0

## 2024-01-19 MED ORDER — PHENAZOPYRIDINE HCL 200 MG PO TABS: 200.0000 mg | ORAL_TABLET | Freq: Three times a day (TID) | ORAL | 0 refills | Status: AC | PRN

## 2024-01-19 MED ORDER — CEPHALEXIN 250 MG PO CAPS
500.0000 mg | ORAL_CAPSULE | Freq: Once | ORAL | Status: AC
  Administered 2024-01-19: 500 mg via ORAL
  Filled 2024-01-19: qty 2

## 2024-01-19 MED ORDER — CEFADROXIL 500 MG PO CAPS: 500.0000 mg | ORAL_CAPSULE | Freq: Two times a day (BID) | ORAL | 0 refills | Status: AC

## 2024-01-19 NOTE — Discharge Instructions (Addendum)
 As discussed, you have a UTI.  Will place you on antibiotics for this.  We have sent off a urine culture and we will call you with antibiotic does not align with the bacteria that grows out on your urine culture.  Recommend follow-up with your primary care for reassessment.

## 2024-01-19 NOTE — ED Provider Notes (Signed)
 [place] EMERGENCY DEPARTMENT AT [facility] Provider Note   CSN: [id number] Arrival date & time: 01/19/24  2102     Patient presents with: Dysuria   Jamie Patton is a 58 y.o. female.    Dysuria   58 year old female presents emergency department with complaints of possible UTI.  States that she has been having burning while urinating, increased urinary urgency/frequency since yesterday.  States she has had a UTI in the past but has been several years.  Denies any fevers, abdominal/flank pain.  States that she was gena wait till Monday morning to see her primary care but her symptoms worsened prompting visit to emergency department.  Past medical history significant for diverticulitis.  Prior to Admission medications   Medication Sig Start Date End Date Taking? Authorizing Provider  phenazopyridine  (PYRIDIUM ) 200 MG tablet Take 1 tablet (200 mg total) by mouth 3 (three) times daily as needed for pain. 01/19/24  Yes Silver Fell A, PA  cefadroxil  (DURICEF) 500 MG capsule Take 1 capsule (500 mg total) by mouth 2 (two) times daily. 01/19/24   Silver Fell LABOR, PA  dicyclomine  (BENTYL ) 20 MG tablet Take 1 tablet (20 mg total) by mouth 3 (three) times daily as needed for spasms. 12/27/17   Patsey Lot, MD  norethindrone-ethinyl estradiol-iron (ESTROSTEP FE,TILIA FE,TRI-LEGEST FE) 1-20/1-30/1-35 MG-MCG tablet Take 1 tablet by mouth daily.    [provider]  traMADol  (ULTRAM ) 50 MG tablet Take 1 tablet (50 mg total) by mouth every 12 (twelve) hours as needed. 12/27/17   Patsey Lot, MD    Allergies: Patient has no known allergies.    Review of Systems  Genitourinary:  Positive for dysuria.  All other systems reviewed and are negative.   Updated Vital Signs BP (!) 147/95   Pulse 100   Temp 97.8 F (36.6 C)   Resp 16   Ht 5' 1 (1.549 m)   Wt 59 kg   SpO2 99%   BMI 24.56 kg/m   Physical Exam Vitals and nursing note reviewed.   Constitutional:      General: She is not in acute distress.    Appearance: She is well-developed.  HENT:     Head: Normocephalic and atraumatic.  Eyes:     Conjunctiva/sclera: Conjunctivae normal.  Cardiovascular:     Rate and Rhythm: Normal rate and regular rhythm.     Heart sounds: No murmur heard. Pulmonary:     Effort: Pulmonary effort is normal. No respiratory distress.     Breath sounds: Normal breath sounds.  Abdominal:     Palpations: Abdomen is soft.     Tenderness: There is no abdominal tenderness. There is no guarding or rebound.  Musculoskeletal:        General: No swelling.     Cervical back: Neck supple.  Skin:    General: Skin is warm and dry.     Capillary Refill: Capillary refill takes less than 2 seconds.  Neurological:     Mental Status: She is alert.  Psychiatric:        Mood and Affect: Mood normal.     (all labs ordered are listed, but only abnormal results are displayed) Labs Reviewed  URINALYSIS, ROUTINE W REFLEX MICROSCOPIC - Abnormal; Notable for the following components:      Result Value   APPearance HAZY (*)    Hgb urine dipstick MODERATE (*)    Protein, ur 30 (*)    Nitrite POSITIVE (*)    Leukocytes,Ua SMALL (*)  All other components within normal limits  URINALYSIS, MICROSCOPIC (REFLEX) - Abnormal; Notable for the following components:   Bacteria, UA MANY (*)    All other components within normal limits  URINE CULTURE    EKG: None  Radiology: No results found.   Procedures   Medications Ordered in the ED  cephALEXin  (KEFLEX ) capsule 500 mg (500 mg Oral Given 01/19/24 2138)                                    Medical Decision Making Amount and/or Complexity of Data Reviewed Labs: ordered.  Risk Prescription drug management.   This patient presents to the ED for concern of dysuria urinary frequency, this involves an extensive number of treatment options, and is a complaint that carries with it a high risk of  complications and morbidity.  The differential diagnosis includes interstitial cystitis, pyelonephritis, sepsis, other   Co morbidities that complicate the patient evaluation  See HPI   Additional history obtained:  Additional history obtained from EMR External records from outside source obtained and reviewed including hospital records   Lab Tests:  I Ordered, and personally interpreted labs.  The pertinent results include: UA with many bacteria, 11-20 RBC/WBC, nitrate and leukocyte positive.  Urine culture pending.   Imaging Studies ordered:  N/a   Cardiac Monitoring: / EKG:  N/a   Consultations Obtained:  N/a   Problem List / ED Course / Critical interventions / Medication management  UTI I ordered medication including keflex     Reevaluation of the patient after these medicines showed that the patient stayed the same I have reviewed the patients home medicines and have made adjustments as needed   Social Determinants of Health:  Denies tobacco, licit drug use.   Test / Admission - Considered:  UTI Vitals signs significant for hypertension blood pressure 147/95. Otherwise within normal range and stable throughout visit. Laboratory studies significant for: See above  58 year old female presents emergency department with complaints of possible UTI.  States that she has been having burning while urinating, increased urinary urgency/frequency since yesterday.  States she has had a UTI in the past but has been several years.  Denies any fevers, abdominal/flank pain.  States that she was gena wait till Monday morning to see her primary care but her symptoms worsened prompting visit to emergency department. On exam, no abdominal or CVA tenderness.  Lungs clear to auscultation bilaterally.  UA concerning for infection.  Patient symptoms most likely secondary to UTI.  Will treat empirically with antibiotics and culture urine.  Recommend follow-up to primary care for  reassessment.  Treatment plan discussed with patient she acknowledged understanding was agreeable.  Patient well-appearing, afebrile in no acute distress upon discharge. Worrisome signs and symptoms were discussed with the patient, and the patient acknowledged understanding to return to the ED if noticed. Patient was stable upon discharge.       Final diagnoses:  Cystitis    ED Discharge Orders          Ordered    cefadroxil  (DURICEF) 500 MG capsule  2 times daily,   Status:  Discontinued        01/19/24 2134    cefadroxil  (DURICEF) 500 MG capsule  2 times daily        01/19/24 2138    phenazopyridine  (PYRIDIUM ) 200 MG tablet  3 times daily PRN        01/19/24  2138               Silver Wonda LABOR, [place] 01/19/24 7794    Lenor Hollering, MD 01/19/24 2211

## 2024-01-19 NOTE — ED Triage Notes (Signed)
Pt c/o dysuria and urgency since yesterday.

## 2024-01-20 ENCOUNTER — Other Ambulatory Visit (HOSPITAL_BASED_OUTPATIENT_CLINIC_OR_DEPARTMENT_OTHER): Payer: Self-pay

## 2024-01-21 LAB — URINE CULTURE: Culture: >=100000 — AB

## 2024-01-22 ENCOUNTER — Telehealth (HOSPITAL_BASED_OUTPATIENT_CLINIC_OR_DEPARTMENT_OTHER): Payer: Self-pay | Admitting: *Deleted

## 2024-01-22 NOTE — Progress Notes (Signed)
ED Antimicrobial Stewardship Positive Culture Follow Up   Jamie Patton is an 58 y.o. female who presented to [facility] on 01/19/2024 with a chief complaint of  Chief Complaint  Patient presents with   Dysuria    Recent Results (from the past 720 hours)  Urine Culture     Status: Abnormal   Collection Time: 01/19/24  9:20 PM   Specimen: Urine, Clean Catch  Result Value Ref Range Status   Specimen Description   Final    URINE, CLEAN CATCH Performed at [facility], [address] [place], [place] [address]    Special Requests   Final    NONE Performed at [facility], [address] [place], [place] [address]    Culture >=100,000 COLONIES/mL CITROBACTER KOSERI (A)  Final   Report Status 01/21/2024 FINAL  Final   Organism ID, Bacteria CITROBACTER KOSERI (A)  Final      Susceptibility   Citrobacter koseri - MIC*    CEFEPIME <=0.12 SENSITIVE Sensitive     ERTAPENEM <=0.12 SENSITIVE Sensitive     CEFTRIAXONE <=0.25 SENSITIVE Sensitive     CIPROFLOXACIN <=0.06 SENSITIVE Sensitive     GENTAMICIN <=1 SENSITIVE Sensitive     NITROFURANTOIN 64 INTERMEDIATE Intermediate     TRIMETH/SULFA <=20 SENSITIVE Sensitive     PIP/TAZO Value in next row Sensitive      <=4 SENSITIVEThis is a modified FDA-approved test that has been validated and its performance characteristics determined by the reporting laboratory.  This laboratory is certified under the Clinical Laboratory Improvement Amendments CLIA as qualified to perform high complexity clinical laboratory testing.    MEROPENEM Value in next row Sensitive      <=4 SENSITIVEThis is a modified FDA-approved test that has been validated and its performance characteristics determined by the reporting laboratory.  This laboratory is certified under the Clinical Laboratory Improvement Amendments CLIA as qualified to perform high complexity clinical laboratory testing.    * >=100,000 COLONIES/mL CITROBACTER KOSERI    [x]   Treated with cefadroxil , organism resistant to prescribed antimicrobial   New antibiotic prescription: Bactrim DS - 1 tablet twice daily x5 days  ED Provider: Sidra Law MD   Koren Or, PharmD Clinical Pharmacist 01/22/2024 8:24 AM Please check AMION for all [facility] numbers

## 2024-01-22 NOTE — Telephone Encounter (Signed)
 Post ED Visit - Positive Culture Follow-up: Unsuccessful Patient Follow-up  Culture assessed and recommendations reviewed by:  [x]  Koren Or, Pharm.D. []  Venetia Gully, Pharm.D., BCPS AQ-ID []  Garrel Crews, Pharm.D., BCPS []  Almarie Lunger, Pharm.D., BCPS []  [place], [address].D., BCPS, AAHIVP []  Rosaline Bihari, Pharm.D., BCPS, AAHIVP []  Massie Rigg, PharmD []  Jodie Rower, PharmD, BCPS  Positive urine culture reviewed by Dr. Sidra Law and he wants to stop Cefadroxil  and start Bactrim DS 1 tab BID fir 5 days.   []  Patient discharged without antimicrobial prescription and treatment is now indicated [x]  Organism is resistant to prescribed ED discharge antimicrobial []  Patient with positive blood cultures   Unable to contact patient after 3 attempts, letter will be sent to address on file  Jama Wyman Kipper 01/22/2024, 11:07 AM

## 2024-01-22 NOTE — Telephone Encounter (Signed)
 Post ED Visit - Positive Culture Follow-up: Successful Patient Follow-Up  Culture assessed and recommendations reviewed by:  [x]  Koren Or, Pharm.D. []  Venetia Gully, Pharm.D., BCPS AQ-ID []  Garrel Crews, Pharm.D., BCPS []  Almarie Lunger, Pharm.D., BCPS []  [place], [address].D., BCPS, AAHIVP []  Rosaline Bihari, Pharm.D., BCPS, AAHIVP []  Vernell Meier, PharmD, BCPS []  Latanya Hint, PharmD, BCPS []  Donald Medley, PharmD, BCPS []  Rocky Bold, PharmD  Positive urine culture  []  Patient discharged without antimicrobial prescription and treatment is now indicated [x]  Organism is resistant to prescribed ED discharge antimicrobial []  Patient with positive blood cultures  Changes discussed with ED provider: Dr. Fonda Law New antibiotic prescription Bactrim DS 1 tab twice daily for 5 days Called to [facility] [phone]  Contacted patient, date 01/22/2024, time 1147   Jamie Patton 01/22/2024, 11:45 AM

## 2024-01-31 ENCOUNTER — Telehealth (HOSPITAL_BASED_OUTPATIENT_CLINIC_OR_DEPARTMENT_OTHER): Payer: Self-pay | Admitting: *Deleted

## 2024-02-04 DIAGNOSIS — K580 Irritable bowel syndrome with diarrhea: Secondary | ICD-10-CM | POA: Diagnosis not present

## 2024-02-28 DIAGNOSIS — B379 Candidiasis, unspecified: Secondary | ICD-10-CM | POA: Diagnosis not present

## 2024-02-28 DIAGNOSIS — N3001 Acute cystitis with hematuria: Secondary | ICD-10-CM | POA: Diagnosis not present
